# Patient Record
Sex: Female | Born: 1995 | Race: Black or African American | Hispanic: No | Marital: Single | Smoking: Former smoker
Health system: Southern US, Community
[De-identification: ages and names within clinical notes are randomized; demographics above are authoritative.]

## PROBLEM LIST (undated history)

## (undated) ENCOUNTER — Inpatient Hospital Stay (HOSPITAL_COMMUNITY): Payer: Self-pay

## (undated) DIAGNOSIS — B009 Herpesviral infection, unspecified: Secondary | ICD-10-CM

## (undated) DIAGNOSIS — F909 Attention-deficit hyperactivity disorder, unspecified type: Secondary | ICD-10-CM

## (undated) DIAGNOSIS — A749 Chlamydial infection, unspecified: Secondary | ICD-10-CM

## (undated) DIAGNOSIS — A64 Unspecified sexually transmitted disease: Secondary | ICD-10-CM

## (undated) HISTORY — DX: Unspecified sexually transmitted disease: A64

## (undated) HISTORY — PX: NO PAST SURGERIES: SHX2092

## (undated) HISTORY — DX: Attention-deficit hyperactivity disorder, unspecified type: F90.9

---
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
## 2016-10-15 ENCOUNTER — Other Ambulatory Visit (HOSPITAL_COMMUNITY): Payer: Self-pay | Admitting: Nurse Practitioner

## 2016-10-15 DIAGNOSIS — Z3689 Encounter for other specified antenatal screening: Secondary | ICD-10-CM

## 2016-10-15 DIAGNOSIS — Z3A18 18 weeks gestation of pregnancy: Secondary | ICD-10-CM

## 2016-10-15 DIAGNOSIS — F1290 Cannabis use, unspecified, uncomplicated: Secondary | ICD-10-CM

## 2016-10-15 LAB — OB RESULTS CONSOLE HIV ANTIBODY (ROUTINE TESTING): HIV: NONREACTIVE

## 2016-10-15 LAB — OB RESULTS CONSOLE ABO/RH: RH Type: POSITIVE

## 2016-10-15 LAB — OB RESULTS CONSOLE RUBELLA ANTIBODY, IGM: Rubella: IMMUNE

## 2016-10-15 LAB — OB RESULTS CONSOLE GC/CHLAMYDIA
Chlamydia: NEGATIVE
GC PROBE AMP, GENITAL: NEGATIVE

## 2016-10-15 LAB — OB RESULTS CONSOLE RPR: RPR: NONREACTIVE

## 2016-10-15 LAB — OB RESULTS CONSOLE HEPATITIS B SURFACE ANTIGEN: Hepatitis B Surface Ag: NEGATIVE

## 2016-10-15 LAB — OB RESULTS CONSOLE ANTIBODY SCREEN: Antibody Screen: NEGATIVE

## 2016-11-12 ENCOUNTER — Encounter (HOSPITAL_COMMUNITY): Payer: Self-pay | Admitting: Nurse Practitioner

## 2016-11-16 ENCOUNTER — Encounter (HOSPITAL_COMMUNITY): Payer: Self-pay | Admitting: *Deleted

## 2016-11-19 ENCOUNTER — Encounter (HOSPITAL_COMMUNITY): Payer: Self-pay

## 2016-11-19 ENCOUNTER — Ambulatory Visit (HOSPITAL_COMMUNITY)
Admission: RE | Admit: 2016-11-19 | Discharge: 2016-11-19 | Disposition: A | Discharge status: Home / Self Care | Payer: Medicaid Other | Source: Ambulatory Visit | Attending: Nurse Practitioner | Admitting: Nurse Practitioner

## 2016-11-19 ENCOUNTER — Other Ambulatory Visit (HOSPITAL_COMMUNITY): Payer: Self-pay | Admitting: *Deleted

## 2016-11-19 ENCOUNTER — Ambulatory Visit (HOSPITAL_COMMUNITY): Admission: RE | Admit: 2016-11-19 | Payer: Medicaid Other | Source: Ambulatory Visit

## 2016-11-19 ENCOUNTER — Other Ambulatory Visit (HOSPITAL_COMMUNITY): Payer: Self-pay | Admitting: Nurse Practitioner

## 2016-11-19 DIAGNOSIS — Z363 Encounter for antenatal screening for malformations: Secondary | ICD-10-CM

## 2016-11-19 DIAGNOSIS — O99212 Obesity complicating pregnancy, second trimester: Secondary | ICD-10-CM

## 2016-11-19 DIAGNOSIS — IMO0002 Reserved for concepts with insufficient information to code with codable children: Secondary | ICD-10-CM

## 2016-11-19 DIAGNOSIS — O99322 Drug use complicating pregnancy, second trimester: Secondary | ICD-10-CM | POA: Diagnosis not present

## 2016-11-19 DIAGNOSIS — Z3A18 18 weeks gestation of pregnancy: Secondary | ICD-10-CM

## 2016-11-19 DIAGNOSIS — F1290 Cannabis use, unspecified, uncomplicated: Secondary | ICD-10-CM

## 2016-11-19 DIAGNOSIS — E669 Obesity, unspecified: Secondary | ICD-10-CM | POA: Insufficient documentation

## 2016-11-19 DIAGNOSIS — Z0489 Encounter for examination and observation for other specified reasons: Secondary | ICD-10-CM

## 2016-11-19 DIAGNOSIS — Z3689 Encounter for other specified antenatal screening: Secondary | ICD-10-CM

## 2016-11-19 DIAGNOSIS — O09892 Supervision of other high risk pregnancies, second trimester: Secondary | ICD-10-CM | POA: Insufficient documentation

## 2016-11-19 HISTORY — DX: Chlamydial infection, unspecified: A74.9

## 2016-11-19 HISTORY — DX: Herpesviral infection, unspecified: B00.9

## 2016-12-17 ENCOUNTER — Ambulatory Visit (HOSPITAL_COMMUNITY)
Admission: RE | Admit: 2016-12-17 | Discharge: 2016-12-17 | Disposition: A | Discharge status: Home / Self Care | Payer: Medicaid Other | Source: Ambulatory Visit | Attending: Nurse Practitioner | Admitting: Nurse Practitioner

## 2017-01-22 NOTE — L&D Delivery Note (Signed)
Delivery Note At 12:44 AM a viable and healthy female was delivered via Vaginal, Spontaneous (Presentation: Left Occiput; Anterior ).  APGAR: 9, 9; weight pending .   Placenta status: spontaneous, intact.  Cord: 3V  Anesthesia:  Epidural  Episiotomy: None Lacerations: None Suture Repair: NA Est. Blood Loss (mL): 50  Mom to postpartum.  Baby to Couplet care / Skin to Skin.  Jill Byrd 04/22/2017, 12:59 AM

## 2017-03-23 ENCOUNTER — Encounter (HOSPITAL_COMMUNITY): Payer: Self-pay

## 2017-03-23 ENCOUNTER — Other Ambulatory Visit: Payer: Self-pay

## 2017-03-23 ENCOUNTER — Inpatient Hospital Stay (HOSPITAL_COMMUNITY)
Admission: AD | Admit: 2017-03-23 | Discharge: 2017-03-23 | Disposition: A | Discharge status: Home / Self Care | Payer: Medicaid Other | Source: Ambulatory Visit | Attending: Obstetrics | Admitting: Obstetrics

## 2017-03-23 DIAGNOSIS — Z87891 Personal history of nicotine dependence: Secondary | ICD-10-CM | POA: Diagnosis not present

## 2017-03-23 DIAGNOSIS — O26893 Other specified pregnancy related conditions, third trimester: Secondary | ICD-10-CM | POA: Diagnosis not present

## 2017-03-23 DIAGNOSIS — N390 Urinary tract infection, site not specified: Secondary | ICD-10-CM | POA: Diagnosis not present

## 2017-03-23 DIAGNOSIS — R110 Nausea: Secondary | ICD-10-CM | POA: Diagnosis not present

## 2017-03-23 DIAGNOSIS — R109 Unspecified abdominal pain: Secondary | ICD-10-CM | POA: Diagnosis present

## 2017-03-23 DIAGNOSIS — Z3A36 36 weeks gestation of pregnancy: Secondary | ICD-10-CM | POA: Insufficient documentation

## 2017-03-23 LAB — URINALYSIS, ROUTINE W REFLEX MICROSCOPIC
BILIRUBIN URINE: NEGATIVE
GLUCOSE, UA: NEGATIVE mg/dL
Ketones, ur: NEGATIVE mg/dL
NITRITE: POSITIVE — AB
PROTEIN: NEGATIVE mg/dL
Specific Gravity, Urine: 1.016 (ref 1.005–1.030)
pH: 6 (ref 5.0–8.0)

## 2017-03-23 LAB — CBC WITH DIFFERENTIAL/PLATELET
BASOS PCT: 0 %
Basophils Absolute: 0 10*3/uL (ref 0.0–0.1)
EOS ABS: 0.1 10*3/uL (ref 0.0–0.7)
EOS PCT: 1 %
HEMATOCRIT: 32.4 % — AB (ref 36.0–46.0)
Hemoglobin: 11.2 g/dL — ABNORMAL LOW (ref 12.0–15.0)
Lymphocytes Relative: 21 %
Lymphs Abs: 2.1 10*3/uL (ref 0.7–4.0)
MCH: 29 pg (ref 26.0–34.0)
MCHC: 34.6 g/dL (ref 30.0–36.0)
MCV: 83.9 fL (ref 78.0–100.0)
MONO ABS: 0.4 10*3/uL (ref 0.1–1.0)
Monocytes Relative: 4 %
NEUTROS ABS: 7.5 10*3/uL (ref 1.7–7.7)
Neutrophils Relative %: 74 %
PLATELETS: 255 10*3/uL (ref 150–400)
RBC: 3.86 MIL/uL — ABNORMAL LOW (ref 3.87–5.11)
RDW: 14.3 % (ref 11.5–15.5)
WBC: 10 10*3/uL (ref 4.0–10.5)

## 2017-03-23 MED ORDER — ONDANSETRON 4 MG PO TBDP: 4.0000 mg | ORAL_TABLET | Freq: Three times a day (TID) | ORAL | 0 refills | Status: DC | PRN

## 2017-03-23 MED ORDER — CEPHALEXIN 500 MG PO CAPS
500.0000 mg | ORAL_CAPSULE | Freq: Once | ORAL | Status: AC
  Administered 2017-03-23: 500 mg via ORAL
  Filled 2017-03-23: qty 1

## 2017-03-23 MED ORDER — ONDANSETRON 8 MG PO TBDP
8.0000 mg | ORAL_TABLET | Freq: Once | ORAL | Status: AC
  Administered 2017-03-23: 8 mg via ORAL
  Filled 2017-03-23: qty 1

## 2017-03-23 MED ORDER — CEPHALEXIN 500 MG PO CAPS: 500.0000 mg | ORAL_CAPSULE | Freq: Four times a day (QID) | ORAL | 0 refills | Status: DC

## 2017-03-23 NOTE — Discharge Instructions (Signed)

## 2017-03-23 NOTE — Progress Notes (Signed)
Patient has left CVA tenderness, explained to her that we have to obtain an UA and to call out when she can.

## 2017-03-23 NOTE — MAU Note (Signed)
Having bad back pain-constant, woke her at 0800,  Was having gas like pains in her abd, but there was no gas.   Got sick on the way here.

## 2017-03-23 NOTE — MAU Provider Note (Signed)
History   G1P0  @ 36.2 wks in with left flank pain, abd pain and nausea since 08:00 this morning. Denies vag bleediong or ROM.  CSN: [id number]  Arrival date & time 03/23/17  1100   None     Chief Complaint  Patient presents with  . Back Pain  . Emesis    HPI  Past Medical History:  Diagnosis Date  . ADHD (attention deficit hyperactivity disorder)   . Chlamydia   . HSV infection   . STD (sexually transmitted disease)    pos chlamydia 12/2010; 04/01/2012    Past Surgical History:  Procedure Laterality Date  . NO PAST SURGERIES      Family History  Problem Relation Age of Onset  . Alcohol abuse Mother   . Depression Mother   . Hypertension Father   . ADD / ADHD Brother   . Hypertension Maternal Grandmother   . Diabetes Maternal Grandmother     Social History   Tobacco Use  . Smoking status: Former Smoker    Packs/day: 1.00    Types: Cigarettes    Last attempt to quit: 09/17/2016    Years since quitting: 0.5  . Smokeless tobacco: Never Used  Substance Use Topics  . Alcohol use: Yes    Alcohol/week: 2.4 oz    Types: 4 Shots of liquor per week    Comment: quit with +UPT  . Drug use: Yes    Types: Marijuana    Comment: quit with +UPT    OB History    Gravida Para Term Preterm AB Living   1 0 0 0 0 0   SAB TAB Ectopic Multiple Live Births   0 0 0 0        Review of Systems  Constitutional: Negative.   HENT: Negative.   Eyes: Negative.   Respiratory: Negative.   Cardiovascular: Negative.   Gastrointestinal: Positive for abdominal pain and nausea.  Endocrine: Negative.   Genitourinary: Negative.   Musculoskeletal: Positive for back pain.  Skin: Negative.   Neurological: Negative.   Hematological: Negative.   Psychiatric/Behavioral: Negative.     Allergies  Patient has no known allergies.  Home Medications    BP (!) 98/59 (BP Location: Right Arm)   Pulse 87   Temp 98.6 F (37 C) (Oral)   Resp 18   Wt 225 lb 12 oz (102.4 kg)   LMP  07/12/2016 (Exact Date)   SpO2 99%   BMI 38.75 kg/m   Physical Exam  Constitutional: She is oriented to person, place, and time. She appears well-developed and well-nourished.  HENT:  Head: Normocephalic.  Eyes: Pupils are equal, round, and reactive to light.  Neck: Normal range of motion.  Cardiovascular: Normal rate, regular rhythm, normal heart sounds and intact distal pulses.  Pulmonary/Chest: Effort normal and breath sounds normal.  Abdominal: Soft. Bowel sounds are normal.  Genitourinary: Vagina normal and uterus normal.  Musculoskeletal: Normal range of motion.  Neurological: She is alert and oriented to person, place, and time. She has normal reflexes.  Skin: Skin is warm and dry.  Psychiatric: She has a normal mood and affect. Her behavior is normal. Judgment and thought content normal.    MAU Course  Procedures (including critical care time)  Labs Reviewed  CBC WITH DIFFERENTIAL/PLATELET - Abnormal; Notable for the following components:      Result Value   RBC 3.86 (*)    Hemoglobin 11.2 (*)    HCT 32.4 (*)    All other  components within normal limits  URINALYSIS, ROUTINE W REFLEX MICROSCOPIC   No results found.   1. Acute left flank pain   2. Abdominal pain in pregnancy, third trimester   3. Nausea without vomiting       MDM  VSS, FHR pattern reactive, no decels, accels noted. No uc's. SVE ft/th/post/high. Left CVAT. CBC normal. U/a pos nitrites. Culture done. POC discussed with Dr. Chestine Spore. Pt tolerating fluids well now. Will d/c home to follow up next week in office.

## 2017-03-24 LAB — URINE CULTURE

## 2017-03-27 LAB — OB RESULTS CONSOLE GBS: GBS: NEGATIVE

## 2017-04-17 ENCOUNTER — Telehealth (HOSPITAL_COMMUNITY): Payer: Self-pay | Admitting: *Deleted

## 2017-04-17 ENCOUNTER — Other Ambulatory Visit: Payer: Self-pay | Admitting: Obstetrics and Gynecology

## 2017-04-18 ENCOUNTER — Encounter (HOSPITAL_COMMUNITY): Payer: Self-pay | Admitting: *Deleted

## 2017-04-18 NOTE — Telephone Encounter (Signed)
Preadmission screen  

## 2017-04-20 ENCOUNTER — Encounter (HOSPITAL_COMMUNITY): Payer: Self-pay | Admitting: Emergency Medicine

## 2017-04-20 ENCOUNTER — Inpatient Hospital Stay (EMERGENCY_DEPARTMENT_HOSPITAL)
Admission: AD | Admit: 2017-04-20 | Discharge: 2017-04-21 | Disposition: A | Discharge status: Home / Self Care | Payer: Medicaid Other | Source: Ambulatory Visit | Attending: Obstetrics and Gynecology | Admitting: Obstetrics and Gynecology

## 2017-04-20 DIAGNOSIS — O99613 Diseases of the digestive system complicating pregnancy, third trimester: Secondary | ICD-10-CM

## 2017-04-20 DIAGNOSIS — Z3A40 40 weeks gestation of pregnancy: Secondary | ICD-10-CM

## 2017-04-20 DIAGNOSIS — K5900 Constipation, unspecified: Secondary | ICD-10-CM

## 2017-04-20 DIAGNOSIS — N760 Acute vaginitis: Secondary | ICD-10-CM

## 2017-04-20 DIAGNOSIS — B9689 Other specified bacterial agents as the cause of diseases classified elsewhere: Secondary | ICD-10-CM

## 2017-04-20 DIAGNOSIS — O479 False labor, unspecified: Secondary | ICD-10-CM

## 2017-04-20 LAB — WET PREP, GENITAL
SPERM: NONE SEEN
TRICH WET PREP: NONE SEEN
Yeast Wet Prep HPF POC: NONE SEEN

## 2017-04-20 NOTE — MAU Provider Note (Signed)
History     CSN: [id number]  Arrival date and time: 04/20/17 2128   First Provider Initiated Contact with Patient 04/20/17 2250      Chief Complaint  Patient presents with  . Abdominal Pain   HPI NEVAEH KORTE is a 22 y.o. G1P0000 at [redacted]w[redacted]d who presents with abdominal pain. Symptoms began earlier today. Reports lower abdominal cramping that comes & goes. Can't tell how frequently it occurs but doesn't think it happens more than 10 times per hour. Rates pain 5/10 when it occurs. Has not treated symptoms. Denies n/v/d, fever/chills, LOF, vaginal bleeding, or dysuria. Endorses change in vaginal discharge since yesterday. Describes as thin off white discharge with foul odor. Last BM was 1 week ago. States she has constipation that is worse with pregnancy and normally does not have BMs very often. Does not treat her constipation.   OB History    Gravida  1   Para  0   Term  0   Preterm  0   AB  0   Living  0     SAB  0   TAB  0   Ectopic  0   Multiple  0   Live Births              Past Medical History:  Diagnosis Date  . ADHD (attention deficit hyperactivity disorder)   . Chlamydia   . HSV infection   . STD (sexually transmitted disease)    pos chlamydia 12/2010; 04/01/2012    Past Surgical History:  Procedure Laterality Date  . NO PAST SURGERIES      Family History  Problem Relation Age of Onset  . Alcohol abuse Mother   . Depression Mother   . Hypertension Father   . ADD / ADHD Brother   . Hypertension Maternal Grandmother   . Diabetes Maternal Grandmother     Social History   Tobacco Use  . Smoking status: Former Smoker    Packs/day: 1.00    Types: Cigarettes    Last attempt to quit: 09/17/2016    Years since quitting: 0.5  . Smokeless tobacco: Never Used  Substance Use Topics  . Alcohol use: Not Currently    Alcohol/week: 2.4 oz    Types: 4 Shots of liquor per week    Comment: quit with +UPT  . Drug use: Not Currently    Types:  Marijuana    Comment: quit with +UPT    Allergies: No Known Allergies  Medications Prior to Admission  Medication Sig Dispense Refill Last Dose  . cephALEXin (KEFLEX) 500 MG capsule Take 1 capsule (500 mg total) by mouth 4 (four) times daily. 40 capsule 0 Past Month at Unknown time  . ondansetron (ZOFRAN ODT) 4 MG disintegrating tablet Take 1 tablet (4 mg total) by mouth every 8 (eight) hours as needed for nausea or vomiting. 20 tablet 0 More than a month at Unknown time    Review of Systems  Constitutional: Negative.   Gastrointestinal: Positive for abdominal pain and constipation. Negative for diarrhea, nausea and vomiting.  Genitourinary: Positive for vaginal discharge. Negative for dysuria and vaginal bleeding.   Physical Exam  Blood pressure 113/60, pulse (!) 101, temperature 97.9 F (36.6 C), temperature source Oral, resp. rate 17, height 5\' 6"  (1.676 m), weight 231 lb 8 oz (105 kg), last menstrual period 07/12/2016, SpO2 98 %.  Physical Exam  Nursing note and vitals reviewed. Constitutional: She is oriented to person, place, and time. She appears well-developed  and well-nourished. No distress.  HENT:  Head: Normocephalic and atraumatic.  Eyes: Conjunctivae are normal. Right eye exhibits no discharge. Left eye exhibits no discharge. No scleral icterus.  Neck: Normal range of motion.  Respiratory: Effort normal. No respiratory distress.  GI: Soft. There is no tenderness.  Neurological: She is alert and oriented to person, place, and time.  Skin: Skin is warm and dry. She is not diaphoretic.  Psychiatric: She has a normal mood and affect. Her behavior is normal. Judgment and thought content normal.    MAU Course  Procedures Results for orders placed or performed during the hospital encounter of 04/20/17 (from the past 24 hour(s))  Urinalysis, Routine w reflex microscopic     Status: Abnormal   Collection Time: 04/20/17  9:48 PM  Result Value Ref Range   Color, Urine  YELLOW YELLOW   APPearance HAZY (A) CLEAR   Specific Gravity, Urine 1.027 1.005 - 1.030   pH 6.0 5.0 - 8.0   Glucose, UA NEGATIVE NEGATIVE mg/dL   Hgb urine dipstick NEGATIVE NEGATIVE   Bilirubin Urine NEGATIVE NEGATIVE   Ketones, ur 5 (A) NEGATIVE mg/dL   Protein, ur 30 (A) NEGATIVE mg/dL   Nitrite NEGATIVE NEGATIVE   Leukocytes, UA TRACE (A) NEGATIVE   RBC / HPF 0-5 0 - 5 RBC/hpf   WBC, UA 6-30 0 - 5 WBC/hpf   Bacteria, UA RARE (A) NONE SEEN   Squamous Epithelial / LPF 0-5 (A) NONE SEEN   Mucus PRESENT    Ca Oxalate Crys, UA PRESENT   Wet prep, genital     Status: Abnormal   Collection Time: 04/20/17 10:59 PM  Result Value Ref Range   Yeast Wet Prep HPF POC NONE SEEN NONE SEEN   Trich, Wet Prep NONE SEEN NONE SEEN   Clue Cells Wet Prep HPF POC PRESENT (A) NONE SEEN   WBC, Wet Prep HPF POC MANY (A) NONE SEEN   Sperm NONE SEEN     MDM NST:  Baseline: 135 bpm, Variability: Good {> 6 bpm), Accelerations: Reactive, Decelerations: Absent and irregular contractions   Dilation: 3.5 Effacement (%): 50 Cervical Position: Middle Station: -3 Presentation: Vertex Exam by:: H.Hardy, RN Cervix unchanged from office visit last week  GC/CT & wet prep collected --- + clues  C/w Dr. Tenny Crawoss. Ok to discharge home. Tx BV.  Assessment and Plan  A: 1. Braxton Hick's contraction   2. [redacted] weeks gestation of pregnancy   3. BV (bacterial vaginosis)   4. Constipation during pregnancy in third trimester    P: Discharge home Rx flagyl & miralax Discussed tx of constipation Keep f/u with OB Discussed reasons to return to MAU GC/CT pending  Judeth Hornrin Sharea Guinther 04/20/2017, 10:50 PM

## 2017-04-20 NOTE — MAU Note (Signed)
Urine sent to lab 

## 2017-04-20 NOTE — MAU Note (Signed)
Pt presents to MAU with back pain that she rates a 5-6 on a scale of 0-10 and the pain comes and goes. Pt c/o abd pain that she rates a 7 on a scale of 0-10. Pt denies vag bleeding. States that she has small amt of discharge. Decrease fetal movement. Denies LOF.

## 2017-04-21 ENCOUNTER — Inpatient Hospital Stay (HOSPITAL_COMMUNITY): Payer: Medicaid Other | Admitting: Anesthesiology

## 2017-04-21 ENCOUNTER — Encounter (HOSPITAL_COMMUNITY): Payer: Self-pay

## 2017-04-21 ENCOUNTER — Inpatient Hospital Stay (HOSPITAL_COMMUNITY)
Admission: AD | Admit: 2017-04-21 | Discharge: 2017-04-23 | DRG: 807 | Disposition: A | Discharge status: Home / Self Care | Payer: Medicaid Other | Source: Ambulatory Visit | Attending: Obstetrics and Gynecology | Admitting: Obstetrics and Gynecology

## 2017-04-21 ENCOUNTER — Other Ambulatory Visit: Payer: Self-pay

## 2017-04-21 DIAGNOSIS — Z3A40 40 weeks gestation of pregnancy: Secondary | ICD-10-CM | POA: Diagnosis not present

## 2017-04-21 DIAGNOSIS — Z3483 Encounter for supervision of other normal pregnancy, third trimester: Secondary | ICD-10-CM | POA: Diagnosis present

## 2017-04-21 DIAGNOSIS — Z3490 Encounter for supervision of normal pregnancy, unspecified, unspecified trimester: Secondary | ICD-10-CM

## 2017-04-21 DIAGNOSIS — O479 False labor, unspecified: Secondary | ICD-10-CM

## 2017-04-21 DIAGNOSIS — O480 Post-term pregnancy: Secondary | ICD-10-CM | POA: Diagnosis present

## 2017-04-21 DIAGNOSIS — Z87891 Personal history of nicotine dependence: Secondary | ICD-10-CM

## 2017-04-21 LAB — CBC
HEMATOCRIT: 35.9 % — AB (ref 36.0–46.0)
HEMOGLOBIN: 11.9 g/dL — AB (ref 12.0–15.0)
MCH: 27.7 pg (ref 26.0–34.0)
MCHC: 33.1 g/dL (ref 30.0–36.0)
MCV: 83.7 fL (ref 78.0–100.0)
Platelets: 295 10*3/uL (ref 150–400)
RBC: 4.29 MIL/uL (ref 3.87–5.11)
RDW: 15 % (ref 11.5–15.5)
WBC: 12.8 10*3/uL — ABNORMAL HIGH (ref 4.0–10.5)

## 2017-04-21 LAB — URINALYSIS, ROUTINE W REFLEX MICROSCOPIC
Bilirubin Urine: NEGATIVE
Glucose, UA: NEGATIVE mg/dL
Hgb urine dipstick: NEGATIVE
Ketones, ur: 5 mg/dL — AB
Nitrite: NEGATIVE
Protein, ur: 30 mg/dL — AB
SPECIFIC GRAVITY, URINE: 1.027 (ref 1.005–1.030)
pH: 6 (ref 5.0–8.0)

## 2017-04-21 LAB — TYPE AND SCREEN
ABO/RH(D): O POS
ANTIBODY SCREEN: NEGATIVE

## 2017-04-21 MED ORDER — OXYTOCIN BOLUS FROM INFUSION: 500.0000 mL | Freq: Once | INTRAVENOUS | Status: DC

## 2017-04-21 MED ORDER — ONDANSETRON HCL 4 MG/2ML IJ SOLN: 4.0000 mg | Freq: Four times a day (QID) | INTRAMUSCULAR | Status: DC | PRN

## 2017-04-21 MED ORDER — DIPHENHYDRAMINE HCL 50 MG/ML IJ SOLN: 12.5000 mg | INTRAMUSCULAR | Status: DC | PRN

## 2017-04-21 MED ORDER — OXYTOCIN 40 UNITS IN LACTATED RINGERS INFUSION - SIMPLE MED
2.5000 [IU]/h | INTRAVENOUS | Status: DC
  Administered 2017-04-22: 2.5 [IU]/h via INTRAVENOUS

## 2017-04-21 MED ORDER — ACETAMINOPHEN 325 MG PO TABS: 650.0000 mg | ORAL_TABLET | ORAL | Status: DC | PRN

## 2017-04-21 MED ORDER — EPHEDRINE 5 MG/ML INJ: 10.0000 mg | INTRAVENOUS | Status: DC | PRN

## 2017-04-21 MED ORDER — LACTATED RINGERS IV SOLN: 500.0000 mL | Freq: Once | INTRAVENOUS | Status: DC

## 2017-04-21 MED ORDER — LIDOCAINE HCL (PF) 1 % IJ SOLN
30.0000 mL | INTRAMUSCULAR | Status: DC | PRN
  Filled 2017-04-21: qty 30

## 2017-04-21 MED ORDER — LIDOCAINE HCL (PF) 1 % IJ SOLN
INTRAMUSCULAR | Status: DC | PRN
  Administered 2017-04-21: 8 mL via EPIDURAL

## 2017-04-21 MED ORDER — BUTORPHANOL TARTRATE 1 MG/ML IJ SOLN: 1.0000 mg | INTRAMUSCULAR | Status: DC | PRN

## 2017-04-21 MED ORDER — LACTATED RINGERS IV SOLN
INTRAVENOUS | Status: DC
  Administered 2017-04-21: 22:00:00 via INTRAVENOUS

## 2017-04-21 MED ORDER — FENTANYL 2.5 MCG/ML BUPIVACAINE 1/10 % EPIDURAL INFUSION (WH - ANES)
14.0000 mL/h | INTRAMUSCULAR | Status: DC | PRN
  Administered 2017-04-21: 14 mL/h via EPIDURAL

## 2017-04-21 MED ORDER — OXYCODONE-ACETAMINOPHEN 5-325 MG PO TABS: 1.0000 | ORAL_TABLET | ORAL | Status: DC | PRN

## 2017-04-21 MED ORDER — SOD CITRATE-CITRIC ACID 500-334 MG/5ML PO SOLN: 30.0000 mL | ORAL | Status: DC | PRN

## 2017-04-21 MED ORDER — OXYTOCIN BOLUS FROM INFUSION
500.0000 mL | Freq: Once | INTRAVENOUS | Status: AC
  Administered 2017-04-22: 500 mL via INTRAVENOUS

## 2017-04-21 MED ORDER — LACTATED RINGERS IV SOLN
INTRAVENOUS | Status: DC
  Administered 2017-04-21: 21:00:00 via INTRAVENOUS

## 2017-04-21 MED ORDER — POLYETHYLENE GLYCOL 3350 17 G PO PACK: 17.0000 g | PACK | Freq: Every day | ORAL | 0 refills | Status: DC | PRN

## 2017-04-21 MED ORDER — FENTANYL 2.5 MCG/ML BUPIVACAINE 1/10 % EPIDURAL INFUSION (WH - ANES)
INTRAMUSCULAR | Status: AC
  Filled 2017-04-21: qty 100

## 2017-04-21 MED ORDER — LACTATED RINGERS IV SOLN: 500.0000 mL | INTRAVENOUS | Status: DC | PRN

## 2017-04-21 MED ORDER — METRONIDAZOLE 500 MG PO TABS: 500.0000 mg | ORAL_TABLET | Freq: Two times a day (BID) | ORAL | 0 refills | Status: DC

## 2017-04-21 MED ORDER — PHENYLEPHRINE 40 MCG/ML (10ML) SYRINGE FOR IV PUSH (FOR BLOOD PRESSURE SUPPORT)
80.0000 ug | PREFILLED_SYRINGE | INTRAVENOUS | Status: DC | PRN
  Filled 2017-04-21: qty 10

## 2017-04-21 MED ORDER — PHENYLEPHRINE 40 MCG/ML (10ML) SYRINGE FOR IV PUSH (FOR BLOOD PRESSURE SUPPORT): 80.0000 ug | PREFILLED_SYRINGE | INTRAVENOUS | Status: DC | PRN

## 2017-04-21 MED ORDER — TERBUTALINE SULFATE 1 MG/ML IJ SOLN: 0.2500 mg | Freq: Once | INTRAMUSCULAR | Status: DC | PRN

## 2017-04-21 MED ORDER — OXYTOCIN 40 UNITS IN LACTATED RINGERS INFUSION - SIMPLE MED
2.5000 [IU]/h | INTRAVENOUS | Status: DC
  Filled 2017-04-21: qty 1000

## 2017-04-21 MED ORDER — LIDOCAINE HCL (PF) 1 % IJ SOLN: 30.0000 mL | INTRAMUSCULAR | Status: DC | PRN

## 2017-04-21 MED ORDER — OXYCODONE-ACETAMINOPHEN 5-325 MG PO TABS: 2.0000 | ORAL_TABLET | ORAL | Status: DC | PRN

## 2017-04-21 MED ORDER — OXYTOCIN 40 UNITS IN LACTATED RINGERS INFUSION - SIMPLE MED: 1.0000 m[IU]/min | INTRAVENOUS | Status: DC

## 2017-04-21 NOTE — MAU Note (Signed)
Urine sent to lab 

## 2017-04-21 NOTE — Anesthesia Procedure Notes (Signed)
Epidural Patient location during procedure: OB Start time: 04/21/2017 9:55 PM End time: 04/21/2017 10:00 PM  Staffing Anesthesiologist: Bethena Midgetddono, Ramesh Moan, MD  Preanesthetic Checklist Completed: patient identified, site marked, surgical consent, pre-op evaluation, timeout performed, IV checked, risks and benefits discussed and monitors and equipment checked  Epidural Patient position: sitting Prep: site prepped and draped and DuraPrep Patient monitoring: continuous pulse ox and blood pressure Approach: midline Location: L3-L4 Injection technique: LOR air  Needle:  Needle type: Tuohy  Needle gauge: 17 G Needle length: 9 cm and 9 Needle insertion depth: 5 cm cm Catheter type: closed end flexible Catheter size: 19 Gauge Catheter at skin depth: 10 cm Test dose: negative  Assessment Events: blood not aspirated, injection not painful, no injection resistance, negative IV test and no paresthesia

## 2017-04-21 NOTE — Anesthesia Preprocedure Evaluation (Signed)

## 2017-04-21 NOTE — Progress Notes (Addendum)
G1 @ 40.[redacted] wksga. Here dt labor ctx progressively stronger today. Denies LOF or bleeding.   GBS -   SVE 6.5/80/BB   Provider notified. Report given. Orders received to admit.   Birthing suite notified. Report given. Room assigned to 164  2059: IV started by RN Amber  2114: Lab at bs drawing labs  2118: Pt to birthing via wheelchair.

## 2017-04-21 NOTE — Discharge Instructions (Signed)
Bacterial Vaginosis Bacterial vaginosis is a vaginal infection that occurs when the normal balance of bacteria in the vagina is disrupted. It results from an overgrowth of certain bacteria. This is the most common vaginal infection among women ages 7-44. Because bacterial vaginosis increases your risk for STIs (sexually transmitted infections), getting treated can help reduce your risk for chlamydia, gonorrhea, herpes, and HIV (human immunodeficiency virus). Treatment is also important for preventing complications in pregnant women, because this condition can cause an early (premature) delivery. What are the causes? This condition is caused by an increase in harmful bacteria that are normally present in small amounts in the vagina. However, the reason that the condition develops is not fully understood. What increases the risk? The following factors may make you more likely to develop this condition:  Having a new sexual partner or multiple sexual partners.  Having unprotected sex.  Douching.  Having an intrauterine device (IUD).  Smoking.  Drug and alcohol abuse.  Taking certain antibiotic medicines.  Being pregnant.  You cannot get bacterial vaginosis from toilet seats, bedding, swimming pools, or contact with objects around you. What are the signs or symptoms? Symptoms of this condition include:  Grey or white vaginal discharge. The discharge can also be watery or foamy.  A fish-like odor with discharge, especially after sexual intercourse or during menstruation.  Itching in and around the vagina.  Burning or pain with urination.  Some women with bacterial vaginosis have no signs or symptoms. How is this diagnosed? This condition is diagnosed based on:  Your medical history.  A physical exam of the vagina.  Testing a sample of vaginal fluid under a microscope to look for a large amount of bad bacteria or abnormal cells. Your health care provider may use a cotton swab  or a small wooden spatula to collect the sample.  How is this treated? This condition is treated with antibiotics. These may be given as a pill, a vaginal cream, or a medicine that is put into the vagina (suppository). If the condition comes back after treatment, a second round of antibiotics may be needed. Follow these instructions at home: Medicines  Take over-the-counter and prescription medicines only as told by your health care provider.  Take or use your antibiotic as told by your health care provider. Do not stop taking or using the antibiotic even if you start to feel better. General instructions  If you have a female sexual partner, tell her that you have a vaginal infection. She should see her health care provider and be treated if she has symptoms. If you have a female sexual partner, he does not need treatment.  During treatment: ? Avoid sexual activity until you finish treatment. ? Do not douche. ? Avoid alcohol as directed by your health care provider. ? Avoid breastfeeding as directed by your health care provider.  Drink enough water and fluids to keep your urine clear or pale yellow.  Keep the area around your vagina and rectum clean. ? Wash the area daily with warm water. ? Wipe yourself from front to back after using the toilet.  Keep all follow-up visits as told by your health care provider. This is important. How is this prevented?  Do not douche.  Wash the outside of your vagina with warm water only.  Use protection when having sex. This includes latex condoms and dental dams.  Limit how many sexual partners you have. To help prevent bacterial vaginosis, it is best to have sex with just  one partner (monogamous).  Make sure you and your sexual partner are tested for STIs.  Wear cotton or cotton-lined underwear.  Avoid wearing tight pants and pantyhose, especially during summer.  Limit the amount of alcohol that you drink.  Do not use any products that  contain nicotine or tobacco, such as cigarettes and e-cigarettes. If you need help quitting, ask your health care provider.  Do not use illegal drugs. Where to find more information:  Centers for Disease Control and Prevention: SolutionApps.co.zawww.cdc.gov/std  American Sexual Health Association (ASHA): www.ashastd.org  U.S. Department of Health and Health and safety inspectorHuman Services, Office on Women's Health: ConventionalMedicines.siwww.womenshealth.gov/ or http://www.anderson-williamson.info/https://www.womenshealth.gov/a-z-topics/bacterial-vaginosis Contact a health care provider if:  Your symptoms do not improve, even after treatment.  You have more discharge or pain when urinating.  You have a fever.  You have pain in your abdomen.  You have pain during sex.  You have vaginal bleeding between periods. Summary  Bacterial vaginosis is a vaginal infection that occurs when the normal balance of bacteria in the vagina is disrupted.  Because bacterial vaginosis increases your risk for STIs (sexually transmitted infections), getting treated can help reduce your risk for chlamydia, gonorrhea, herpes, and HIV (human immunodeficiency virus). Treatment is also important for preventing complications in pregnant women, because the condition can cause an early (premature) delivery.  This condition is treated with antibiotic medicines. These may be given as a pill, a vaginal cream, or a medicine that is put into the vagina (suppository). This information is not intended to replace advice given to you by your health care provider. Make sure you discuss any questions you have with your health care provider. Document Released: 01/08/2005 Document Revised: 05/14/2016 Document Reviewed: 09/24/2015 Elsevier Interactive Patient Education  2018 [employer] Constipation, Adult Constipation is when a person has fewer bowel movements in a week than normal, has difficulty having a bowel movement, or has stools that are dry, hard, or larger than normal. Constipation may be caused by an underlying  condition. It may become worse with age if a person takes certain medicines and does not take in enough fluids. Follow these instructions at home: Eating and drinking   Eat foods that have a lot of fiber, such as fresh fruits and vegetables, whole grains, and beans.  Limit foods that are high in fat, low in fiber, or overly processed, such as french fries, hamburgers, cookies, candies, and soda.  Drink enough fluid to keep your urine clear or pale yellow. General instructions  Exercise regularly or as told by your health care provider.  Go to the restroom when you have the urge to go. Do not hold it in.  Take over-the-counter and prescription medicines only as told by your health care provider. These include any fiber supplements.  Practice pelvic floor retraining exercises, such as deep breathing while relaxing the lower abdomen and pelvic floor relaxation during bowel movements.  Watch your condition for any changes.  Keep all follow-up visits as told by your health care provider. This is important. Contact a health care provider if:  You have pain that gets worse.  You have a fever.  You do not have a bowel movement after 4 days.  You vomit.  You are not hungry.  You lose weight.  You are bleeding from the anus.  You have thin, pencil-like stools. Get help right away if:  You have a fever and your symptoms suddenly get worse.  You leak stool or have blood in your stool.  Your abdomen is  bloated.  You have severe pain in your abdomen.  You feel dizzy or you faint. This information is not intended to replace advice given to you by your health care provider. Make sure you discuss any questions you have with your health care provider. Document Released: 10/07/2003 Document Revised: 07/29/2015 Document Reviewed: 06/29/2015 Elsevier Interactive Patient Education  2018 [employer]

## 2017-04-22 ENCOUNTER — Encounter (HOSPITAL_COMMUNITY): Payer: Self-pay

## 2017-04-22 ENCOUNTER — Other Ambulatory Visit: Payer: Self-pay

## 2017-04-22 LAB — GC/CHLAMYDIA PROBE AMP (~~LOC~~) NOT AT ARMC
Chlamydia: NEGATIVE
NEISSERIA GONORRHEA: NEGATIVE

## 2017-04-22 LAB — CBC
HCT: 32.7 % — ABNORMAL LOW (ref 36.0–46.0)
HEMOGLOBIN: 11.1 g/dL — AB (ref 12.0–15.0)
MCH: 28 pg (ref 26.0–34.0)
MCHC: 33.9 g/dL (ref 30.0–36.0)
MCV: 82.6 fL (ref 78.0–100.0)
PLATELETS: 257 10*3/uL (ref 150–400)
RBC: 3.96 MIL/uL (ref 3.87–5.11)
RDW: 15 % (ref 11.5–15.5)
WBC: 15.8 10*3/uL — ABNORMAL HIGH (ref 4.0–10.5)

## 2017-04-22 LAB — ABO/RH: ABO/RH(D): O POS

## 2017-04-22 LAB — RPR: RPR Ser Ql: NONREACTIVE

## 2017-04-22 MED ORDER — SIMETHICONE 80 MG PO CHEW: 80.0000 mg | CHEWABLE_TABLET | ORAL | Status: DC | PRN

## 2017-04-22 MED ORDER — METHYLERGONOVINE MALEATE 0.2 MG/ML IJ SOLN: 0.2000 mg | INTRAMUSCULAR | Status: DC | PRN

## 2017-04-22 MED ORDER — COCONUT OIL OIL
1.0000 "application " | TOPICAL_OIL | Status: DC | PRN
  Filled 2017-04-22: qty 120

## 2017-04-22 MED ORDER — ONDANSETRON HCL 4 MG/2ML IJ SOLN: 4.0000 mg | INTRAMUSCULAR | Status: DC | PRN

## 2017-04-22 MED ORDER — DIBUCAINE 1 % RE OINT
1.0000 "application " | TOPICAL_OINTMENT | RECTAL | Status: DC | PRN
  Filled 2017-04-22: qty 28

## 2017-04-22 MED ORDER — ONDANSETRON HCL 4 MG PO TABS: 4.0000 mg | ORAL_TABLET | ORAL | Status: DC | PRN

## 2017-04-22 MED ORDER — WITCH HAZEL-GLYCERIN EX PADS: 1.0000 "application " | MEDICATED_PAD | CUTANEOUS | Status: DC | PRN

## 2017-04-22 MED ORDER — IBUPROFEN 600 MG PO TABS
600.0000 mg | ORAL_TABLET | Freq: Four times a day (QID) | ORAL | Status: DC
  Administered 2017-04-22 – 2017-04-23 (×4): 600 mg via ORAL
  Filled 2017-04-22 (×4): qty 1

## 2017-04-22 MED ORDER — DIPHENHYDRAMINE HCL 25 MG PO CAPS: 25.0000 mg | ORAL_CAPSULE | Freq: Four times a day (QID) | ORAL | Status: DC | PRN

## 2017-04-22 MED ORDER — ZOLPIDEM TARTRATE 5 MG PO TABS: 5.0000 mg | ORAL_TABLET | Freq: Every evening | ORAL | Status: DC | PRN

## 2017-04-22 MED ORDER — BENZOCAINE-MENTHOL 20-0.5 % EX AERO
1.0000 "application " | INHALATION_SPRAY | CUTANEOUS | Status: DC | PRN
  Filled 2017-04-22 (×2): qty 56

## 2017-04-22 MED ORDER — OXYCODONE-ACETAMINOPHEN 5-325 MG PO TABS: 1.0000 | ORAL_TABLET | ORAL | Status: DC | PRN

## 2017-04-22 MED ORDER — TETANUS-DIPHTH-ACELL PERTUSSIS 5-2.5-18.5 LF-MCG/0.5 IM SUSP: 0.5000 mL | Freq: Once | INTRAMUSCULAR | Status: DC

## 2017-04-22 MED ORDER — METHYLERGONOVINE MALEATE 0.2 MG PO TABS: 0.2000 mg | ORAL_TABLET | ORAL | Status: DC | PRN

## 2017-04-22 MED ORDER — OXYCODONE-ACETAMINOPHEN 5-325 MG PO TABS: 2.0000 | ORAL_TABLET | ORAL | Status: DC | PRN

## 2017-04-22 MED ORDER — SENNOSIDES-DOCUSATE SODIUM 8.6-50 MG PO TABS
2.0000 | ORAL_TABLET | ORAL | Status: DC
  Administered 2017-04-22: 2 via ORAL
  Filled 2017-04-22: qty 2

## 2017-04-22 MED ORDER — PRENATAL MULTIVITAMIN CH
1.0000 | ORAL_TABLET | Freq: Every day | ORAL | Status: DC
  Administered 2017-04-22: 1 via ORAL
  Filled 2017-04-22: qty 1

## 2017-04-22 MED ORDER — ACETAMINOPHEN 325 MG PO TABS: 650.0000 mg | ORAL_TABLET | ORAL | Status: DC | PRN

## 2017-04-22 NOTE — Addendum Note (Signed)
Addendum  created 04/22/17 0902 by Shanon PayorGregory, Hajira Verhagen M, CRNA   Charge Capture section accepted, Sign clinical note

## 2017-04-22 NOTE — Anesthesia Postprocedure Evaluation (Signed)
Anesthesia Post Note  Patient: Jill LeatherwoodMariah M Byrd  Procedure(s) Performed: AN AD HOC LABOR EPIDURAL     Patient location during evaluation: Mother Baby Anesthesia Type: Epidural Level of consciousness: awake and alert Pain management: pain level controlled Vital Signs Assessment: post-procedure vital signs reviewed and stable Respiratory status: spontaneous breathing, nonlabored ventilation and respiratory function stable Cardiovascular status: stable Postop Assessment: no headache, no backache and epidural receding Anesthetic complications: no    Last Vitals:  Vitals:   04/22/17 0210 04/22/17 0319  BP:  (!) 103/56  Pulse:  92  Resp: 16 18  Temp: 36.9 C 36.9 C  SpO2:  100%    Last Pain:  Vitals:   04/22/17 0319  TempSrc: Oral  PainSc: 0-No pain   Pain Goal: Patients Stated Pain Goal: 4 (04/21/17 2042)               Dedrick Heffner

## 2017-04-22 NOTE — H&P (Signed)
Jill Byrd is a 22 y.o. female presenting for labor  22 yo G1P0 @ 40+3 presents for painful contractions and was confirmed to be in labor. She initiated prenatal care at 25 weeks in our office [facility] OB History    Gravida  1   Para  0   Term  0   Preterm  0   AB  0   Living  0     SAB  0   TAB  0   Ectopic  0   Multiple  0   Live Births             Past Medical History:  Diagnosis Date  . ADHD (attention deficit hyperactivity disorder)   . Chlamydia   . HSV infection   . STD (sexually transmitted disease)    pos chlamydia 12/2010; 04/01/2012   Past Surgical History:  Procedure Laterality Date  . NO PAST SURGERIES     Family History: family history includes ADD / ADHD in her brother; Alcohol abuse in her mother; Depression in her mother; Diabetes in her maternal grandmother; Hypertension in her father and maternal grandmother. Social History:  reports that she quit smoking about 7 months ago. Her smoking use included cigarettes. She smoked 1.00 pack per day. She has never used smokeless tobacco. She reports that she drank about 2.4 oz of alcohol per week. She reports that she has current or past drug history. Drug: Marijuana.     Maternal Diabetes: No Genetic Screening: Normal Maternal Ultrasounds/Referrals: Normal Fetal Ultrasounds or other Referrals:  None Maternal Substance Abuse:  Yes:  Type: Marijuana Significant Maternal Medications:  None Significant Maternal Lab Results:  None Other Comments:  None  ROS History Dilation: 10 Effacement (%): 80 Station: Plus 2 Exam by:: Lexie Ament, RN Blood pressure 109/61, pulse 100, temperature 98.5 F (36.9 C), temperature source Axillary, resp. rate 18, height 5\' 6"  (1.676 m), weight 103 kg (227 lb), last menstrual period 07/12/2016, SpO2 100 %. Exam Physical Exam  Prenatal labs: ABO, Rh: --/--/O POS (03/31 2114) Antibody: NEG (03/31 2114) Rubella: Immune  (09/24 0000) RPR: Nonreactive (09/24 0000)  HBsAg: Negative (09/24 0000)  HIV: Non-reactive (09/24 0000)  GBS: Negative (03/06 0000)   Assessment/Plan: 1) Admit 2) Expectant management  3) Anticipate svd 4) Epidural on request   Waynard ReedsKendra Trinity Hyland 04/22/2017, 1:00 AM

## 2017-04-22 NOTE — Lactation Note (Signed)
This note was copied from a baby's chart. Lactation Consultation Note  Patient Name: Boy Doreatha LewMariah Wilmouth ZOXWR'UToday's Date: 04/22/2017 Reason for consult: Initial assessment;Primapara;Term Breastfeeding consultation services and support information given to patient.  Baby is 12 hours old and feeding well on right side.  Mom having difficulty on left.  Assisted with positioning baby in football hold on left side.  Instructed on hand expression and large drop expressed.  Baby latched easily with good breast compression.  Observed baby feed actively for 10 minutes and baby continued to feed when I left.  Basic teaching done and questions answered.  Instructed to feed with cues and call for assist prn.  Maternal Data Has patient been taught Hand Expression?: Yes Does the patient have breastfeeding experience prior to this delivery?: No  Feeding Feeding Type: Breast Fed Length of feed: 14 min  LATCH Score Latch: Grasps breast easily, tongue down, lips flanged, rhythmical sucking.  Audible Swallowing: A few with stimulation  Type of Nipple: Everted at rest and after stimulation  Comfort (Breast/Nipple): Soft / non-tender  Hold (Positioning): Assistance needed to correctly position infant at breast and maintain latch.  LATCH Score: 8  Interventions Interventions: Breast feeding basics reviewed;Assisted with latch;Breast compression;Skin to skin;Adjust position;Breast massage;Support pillows;Hand express;Position options  Lactation Tools Discussed/Used     Consult Status Consult Status: Follow-up Date: 04/23/17 Follow-up type: In-patient    Huston FoleyMOULDEN, Garan Frappier S 04/22/2017, 1:15 PM

## 2017-04-22 NOTE — Anesthesia Postprocedure Evaluation (Signed)
Anesthesia Post Note  Patient: Jill LeatherwoodMariah M Byrd  Procedure(s) Performed: AN AD HOC LABOR EPIDURAL     Patient location during evaluation: Mother Baby Anesthesia Type: Epidural Level of consciousness: awake and alert and oriented Pain management: satisfactory to patient Vital Signs Assessment: post-procedure vital signs reviewed and stable Respiratory status: spontaneous breathing and nonlabored ventilation Cardiovascular status: stable Postop Assessment: no headache, no backache, no signs of nausea or vomiting, adequate PO intake and patient able to bend at knees (patient up walking) Anesthetic complications: no    Last Vitals:  Vitals:   04/22/17 0319 04/22/17 0730  BP: (!) 103/56 (!) 104/54  Pulse: 92 88  Resp: 18 17  Temp: 36.9 C 36.6 C  SpO2: 100%     Last Pain:  Vitals:   04/22/17 0730  TempSrc: Oral  PainSc: 0-No pain   Pain Goal: Patients Stated Pain Goal: 4 (04/21/17 2042)               Madison HickmanGREGORY,Vincent Streater

## 2017-04-22 NOTE — Progress Notes (Signed)
PPD0  Stopped in to check on patient and she appears to be sleeping comfortably so I did not wake her.  Vitals:   04/22/17 0201 04/22/17 0210 04/22/17 0319 04/22/17 0730  BP: (!) 111/58  (!) 103/56 (!) 104/54  Pulse: 85  92 88  Resp:  16 18 17   Temp:  98.4 F (36.9 C) 98.4 F (36.9 C) 97.9 F (36.6 C)  TempSrc:  Oral Oral Oral  SpO2:   100%   Weight:      Height:        General: NAD  Lab Results  Component Value Date   WBC 15.8 (H) 04/22/2017   HGB 11.1 (L) 04/22/2017   HCT 32.7 (L) 04/22/2017   MCV 82.6 04/22/2017   PLT 257 04/22/2017    --/--/O POS, O POS Performed at [facility], [address] [place], [place] [address]  (03/31 2114)  A/P Post partum day 0 s/p NSVD  Routine care.   Philip AspenALLAHAN, Barbee Mamula

## 2017-04-23 NOTE — Discharge Summary (Signed)
Obstetric Discharge Summary Reason for Admission: onset of labor Prenatal Procedures: ultrasound Intrapartum Procedures: spontaneous vaginal delivery Postpartum Procedures: none Complications-Operative and Postpartum: none Hemoglobin  Date Value Ref Range Status  04/22/2017 11.1 (L) 12.0 - 15.0 g/dL Final   HCT  Date Value Ref Range Status  04/22/2017 32.7 (L) 36.0 - 46.0 % Final    Physical Exam:  General: alert Lochia: appropriate Uterine Fundus: firm  Discharge Diagnoses: Term Pregnancy-delivered  Discharge Information: Date: 04/23/2017 Activity: pelvic rest Diet: routine Medications: PNV Condition: stable Instructions: refer to practice specific booklet Discharge to: home Follow-up Information    Philip AspenCallahan, Sidney, DO. Schedule an appointment as soon as possible for a visit in 1 month(s).   Specialty:  Obstetrics and Gynecology Contact information: [address] [place] [address] [phone]           Newborn Data: Live born female  Birth Weight: 6 lb 10.5 oz (3019 g) APGAR: 9, 9  Newborn Delivery   Birth date/time:  04/22/2017 00:44:00 Delivery type:  Vaginal, Spontaneous     Home with mother.  Murl Golladay E 04/23/2017, 9:31 AM

## 2017-04-23 NOTE — Progress Notes (Signed)
PPD#1 Pt without complaints . Would like to go home.  VSSAF IMP/ stable Plan/ Discharge

## 2017-04-25 ENCOUNTER — Inpatient Hospital Stay (HOSPITAL_COMMUNITY): Admission: RE | Admit: 2017-04-25 | Payer: Medicaid Other | Source: Ambulatory Visit

## 2017-07-02 ENCOUNTER — Emergency Department (HOSPITAL_COMMUNITY): Payer: Medicaid Other

## 2017-07-02 ENCOUNTER — Other Ambulatory Visit: Payer: Self-pay

## 2017-07-02 ENCOUNTER — Emergency Department (HOSPITAL_COMMUNITY)
Admission: EM | Admit: 2017-07-02 | Discharge: 2017-07-02 | Disposition: A | Discharge status: Home / Self Care | Payer: Medicaid Other | Attending: Emergency Medicine | Admitting: Emergency Medicine

## 2017-07-02 DIAGNOSIS — R51 Headache: Secondary | ICD-10-CM

## 2017-07-02 DIAGNOSIS — Z87891 Personal history of nicotine dependence: Secondary | ICD-10-CM | POA: Insufficient documentation

## 2017-07-02 DIAGNOSIS — Z79899 Other long term (current) drug therapy: Secondary | ICD-10-CM | POA: Insufficient documentation

## 2017-07-02 DIAGNOSIS — G43809 Other migraine, not intractable, without status migrainosus: Secondary | ICD-10-CM | POA: Insufficient documentation

## 2017-07-02 DIAGNOSIS — R519 Headache, unspecified: Secondary | ICD-10-CM

## 2017-07-02 LAB — POC URINE PREG, ED: PREG TEST UR: NEGATIVE

## 2017-07-02 MED ORDER — KETOROLAC TROMETHAMINE 15 MG/ML IJ SOLN
15.0000 mg | Freq: Once | INTRAMUSCULAR | Status: AC
  Administered 2017-07-02: 15 mg via INTRAVENOUS
  Filled 2017-07-02: qty 1

## 2017-07-02 MED ORDER — DEXAMETHASONE SODIUM PHOSPHATE 10 MG/ML IJ SOLN
10.0000 mg | Freq: Once | INTRAMUSCULAR | Status: AC
  Administered 2017-07-02: 10 mg via INTRAVENOUS
  Filled 2017-07-02: qty 1

## 2017-07-02 MED ORDER — SODIUM CHLORIDE 0.9 % IV BOLUS
1000.0000 mL | Freq: Once | INTRAVENOUS | Status: AC
  Administered 2017-07-02: 1000 mL via INTRAVENOUS

## 2017-07-02 NOTE — ED Provider Notes (Signed)
[facility]-EMERGENCY DEPT Provider Note   CSN: [id number] Arrival date & time: 07/02/17  4098     History   Chief Complaint Chief Complaint  Patient presents with  . Headache    HPI Jill Byrd is a 22 y.o. female.  22yo female presents with complaint of headache onset 3 days ago, constant, worse with any changes in position, lights and sounds, throbbing in nature, located frontal and occipital areas. Tried tylenol yesterday without relief. No history of similar headaches/headaches previously, denies changes in vision/speech/gait, denies nausea, vomiting. No prior head imaging. Reports normal vaginal delivery 2 months ago, no complications, no hx of HTN.     Past Medical History:  Diagnosis Date  . ADHD (attention deficit hyperactivity disorder)   . Chlamydia   . HSV infection   . STD (sexually transmitted disease)    pos chlamydia 12/2010; 04/01/2012    Patient Active Problem List   Diagnosis Date Noted  . Spontaneous vaginal delivery 04/22/2017  . Pregnancy 04/21/2017  . Post-dates pregnancy 04/21/2017  . Chlamydia infection 04/09/2012  . ADD (attention deficit disorder) 10/11/2011    Past Surgical History:  Procedure Laterality Date  . NO PAST SURGERIES       OB History    Gravida  1   Para  1   Term  1   Preterm  0   AB  0   Living  1     SAB  0   TAB  0   Ectopic  0   Multiple  0   Live Births  1            Home Medications    Prior to Admission medications   Medication Sig Start Date End Date Taking? Authorizing Provider  acetaminophen (TYLENOL) 500 MG tablet Take 1,000 mg by mouth daily as needed (headache).   Yes [provider]    Family History Family History  Problem Relation Age of Onset  . Alcohol abuse Mother   . Depression Mother   . Hypertension Father   . ADD / ADHD Brother   . Hypertension Maternal Grandmother   . Diabetes Maternal Grandmother     Social History Social  History   Tobacco Use  . Smoking status: Former Smoker    Packs/day: 1.00    Types: Cigarettes    Last attempt to quit: 09/17/2016    Years since quitting: 0.7  . Smokeless tobacco: Never Used  Substance Use Topics  . Alcohol use: Not Currently    Alcohol/week: 2.4 oz    Types: 4 Shots of liquor per week    Comment: quit with +UPT  . Drug use: Not Currently    Types: Marijuana    Comment: quit with +UPT     Allergies   Patient has no known allergies.   Review of Systems Review of Systems  Constitutional: Negative for chills and fever.  HENT: Negative for congestion, ear discharge, ear pain, sinus pressure, sinus pain, sneezing and sore throat.   Eyes: Negative for visual disturbance.  Respiratory: Negative for cough.   Gastrointestinal: Negative for abdominal pain, constipation, diarrhea, nausea and vomiting.  Genitourinary: Negative for dysuria, frequency and urgency.  Musculoskeletal: Negative for neck pain and neck stiffness.  Skin: Negative for rash and wound.  Allergic/Immunologic: Negative for immunocompromised state.  Neurological: Positive for headaches. Negative for dizziness, facial asymmetry, speech difficulty and weakness.  Hematological: Does not bruise/bleed easily.  Psychiatric/Behavioral: Negative for confusion.  All other  systems reviewed and are negative.    Physical Exam Updated Vital Signs BP 104/66   Pulse 86   Temp 99.5 F (37.5 C) (Oral)   Resp 15   Ht 5\' 6"  (1.676 m)   Wt 99.8 kg (220 lb)   LMP 06/07/2016   SpO2 100%   BMI 35.51 kg/m   Physical Exam  Constitutional: She is oriented to person, place, and time. She appears well-developed and well-nourished.  Non-toxic appearance. She does not appear ill. No distress.  HENT:  Head: Normocephalic and atraumatic.  Mouth/Throat: Oropharynx is clear and moist.  Eyes: Pupils are equal, round, and reactive to light. EOM are normal. Right eye exhibits no nystagmus. Left eye exhibits no  nystagmus. Pupils are equal.  Neck: Normal range of motion. Neck supple.  Cardiovascular: Normal rate, regular rhythm, normal heart sounds and intact distal pulses.  No murmur heard. Pulmonary/Chest: Effort normal. No stridor. No respiratory distress.  Abdominal: Soft. There is no tenderness.  Neurological: She is alert and oriented to person, place, and time. She has normal strength. She displays normal reflexes. No cranial nerve deficit. GCS eye subscore is 4. GCS verbal subscore is 5. GCS motor subscore is 6. She displays no Babinski's sign on the right side. She displays no Babinski's sign on the left side.  No pronator drift, normal heel to shin  Skin: Skin is warm and dry. No rash noted.  Psychiatric: She has a normal mood and affect. Her behavior is normal.  Nursing note and vitals reviewed.    ED Treatments / Results  Labs (all labs ordered are listed, but only abnormal results are displayed) Labs Reviewed  POC URINE PREG, ED    EKG None  Radiology Ct Head Wo Contrast  Result Date: 07/02/2017 CLINICAL DATA:  Severe headache for 2 days. EXAM: CT HEAD WITHOUT CONTRAST TECHNIQUE: Contiguous axial images were obtained from the base of the skull through the vertex without intravenous contrast. COMPARISON:  None. FINDINGS: Brain: The ventricles are normal in size and configuration. No extra-axial fluid collections are identified. The gray-white differentiation is maintained. No CT findings for acute hemispheric infarction or intracranial hemorrhage. No mass lesions. The brainstem and cerebellum are normal. Vascular: No hyperdense vessels or obvious aneurysm. Skull: No acute skull fracture.  No bone lesion. Sinuses/Orbits: The paranasal sinuses and mastoid air cells are clear. The globes are intact. Other: No scalp lesions, laceration or hematoma. IMPRESSION: Normal head CT. Electronically Signed   By: Rudie MeyerP.  Gallerani M.D.   On: 07/02/2017 13:00    Procedures Procedures (including  critical care time)  Medications Ordered in ED Medications  sodium chloride 0.9 % bolus 1,000 mL (1,000 mLs Intravenous New Bag/Given 07/02/17 1259)  ketorolac (TORADOL) 15 MG/ML injection 15 mg (has no administration in time range)  dexamethasone (DECADRON) injection 10 mg (has no administration in time range)     Initial Impression / Assessment and Plan / ED Course  I have reviewed the triage vital signs and the nursing notes.  Pertinent labs & imaging results that were available during my care of the patient were reviewed by me and considered in my medical decision making (see chart for details).  Clinical Course as of Jul 02 1321  Tue Jul 02, 2017  1316 Negative. Good for Ct.   Preg Test, Ur: NEGATIVE [LM]  1316 Headache resolved..  CT Head Wo Contrast [LM]  1320 22 yo female presents with headache x 3 days, no hx of similar headaches previously. Gradual onset,  no hx or family history of aneurism. Neuro exam normal. Patient given fluids, headache improving. CT head normal- no evidence of bleed or masses. Patient will be given Toradol and Decadron prior to dc, encouraged to follow up with PCP, return to ER for any worsening or concerning symptoms.    [LM]    Clinical Course User Index [LM] Jeannie Fend, PA-C      Final Clinical Impressions(s) / ED Diagnoses   Final diagnoses:  Acute nonintractable headache, unspecified headache type    ED Discharge Orders    None       Alden Hipp 07/02/17 1323    Mesner, Barbara Cower, MD 07/02/17 1528

## 2017-07-02 NOTE — ED Triage Notes (Signed)
Pt states that every time she sits or stands up, she gets an extreme headache. Has been occurring for 2 days. Today it was the worst.

## 2017-07-02 NOTE — Discharge Instructions (Addendum)
Follow up with your doctor, return to ER for worsening or concerning symptoms. Home to rest, Motrin/Tylenol as needed as directed, hydrating fluids (Gatorade).

## 2020-01-15 IMAGING — CT CT HEAD W/O CM
3 series · 15 of 46 positions shown, 18 images · non-contrast
Comparison: None.

CLINICAL DATA: Severe headache for 2 days.

EXAM:
CT HEAD WITHOUT CONTRAST
TECHNIQUE: Contiguous axial images were obtained from the base of the skull
through the vertex without intravenous contrast.

[Series 2: head wo · axial · 0.43mm/px · z∈[-124,-4]mm · 9 of 29 slices shown, 12 images]
[im 3/29  brain]
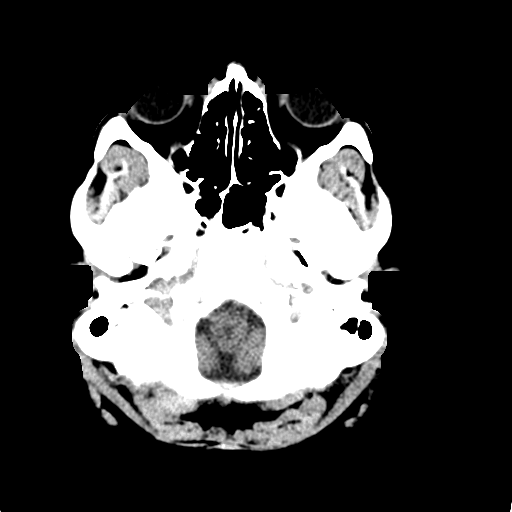
[im 3/29  bone]
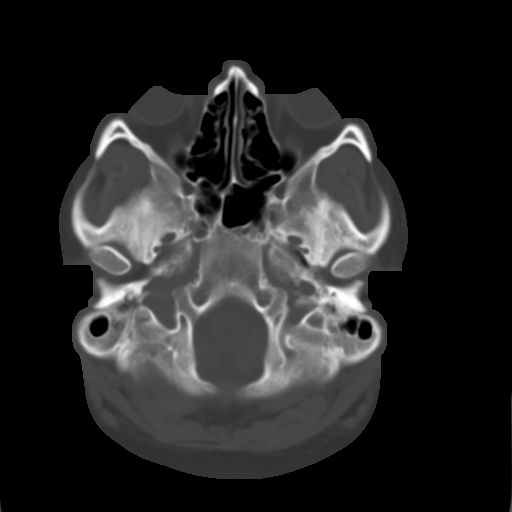
[im 6/29  brain]
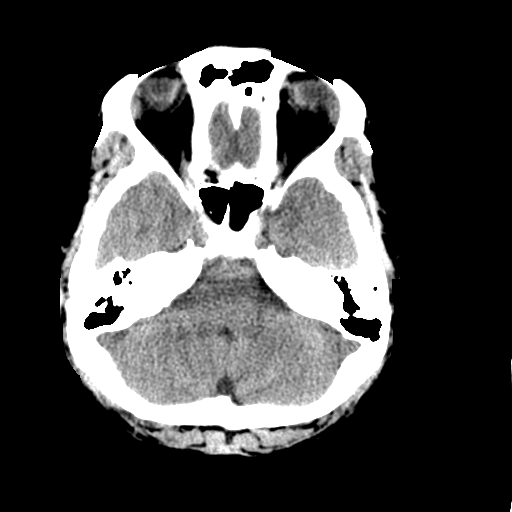
[im 9/29  brain]
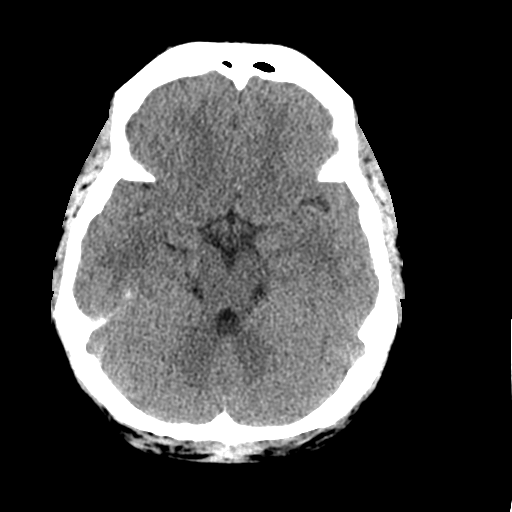
[im 12/29  brain]
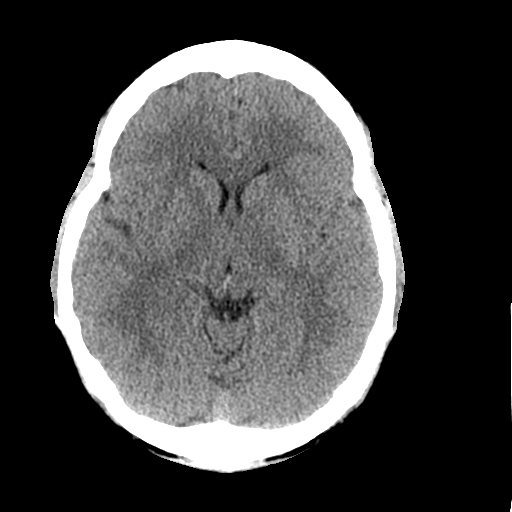
[im 15/29  brain]
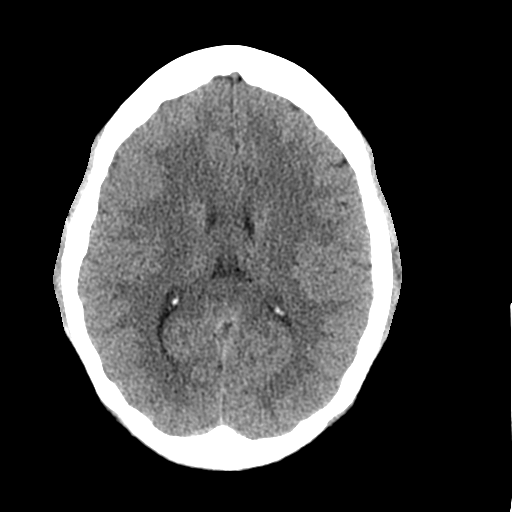
[im 15/29  bone]
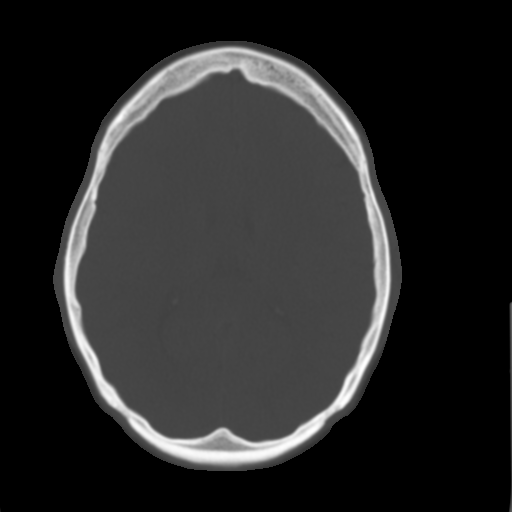
[im 18/29  brain]
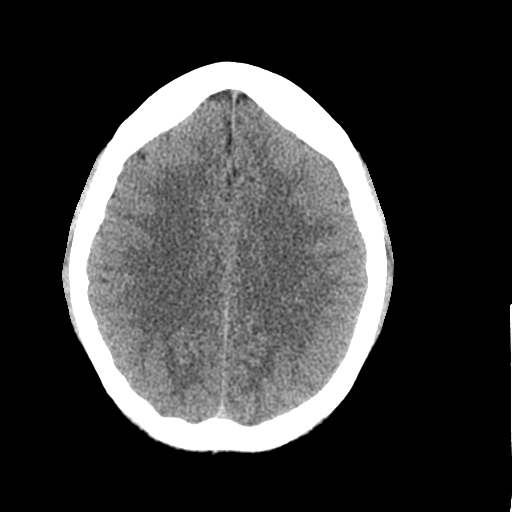
[im 21/29  brain]
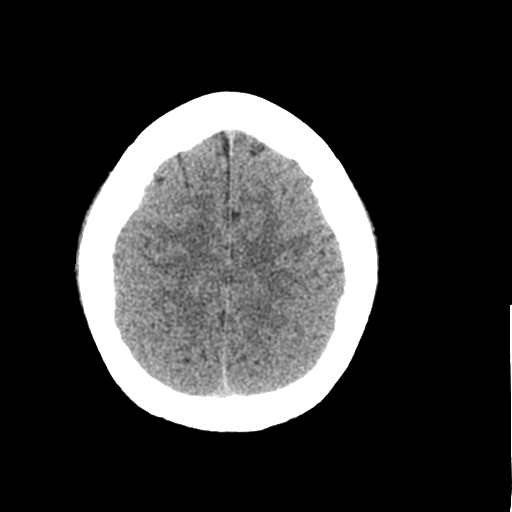
[im 24/29  brain]
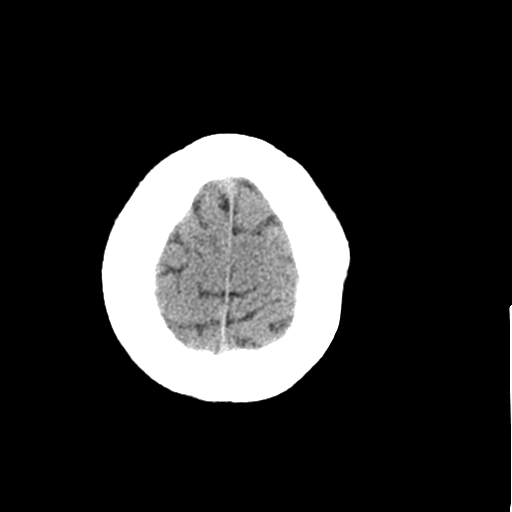
[im 27/29  brain]
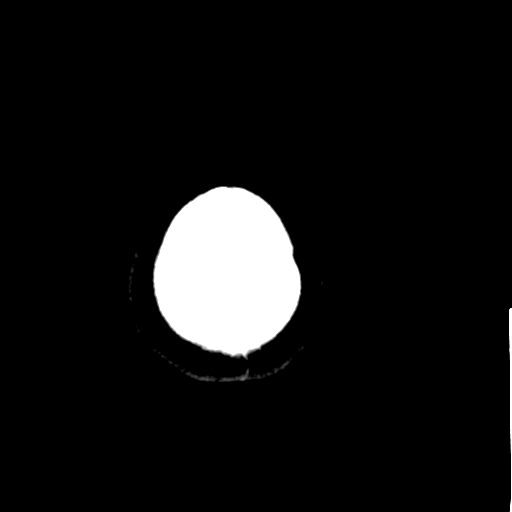
[im 27/29  bone]
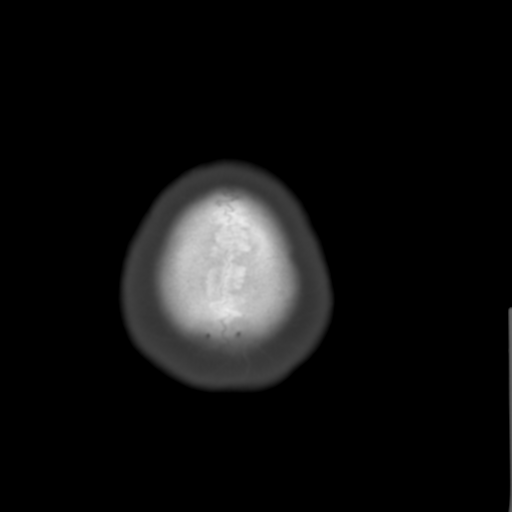

[Series 4: coronal soft tissue · coronal · 0.28mm/px · 3 of 66 slices shown]
[im 22/66  brain]
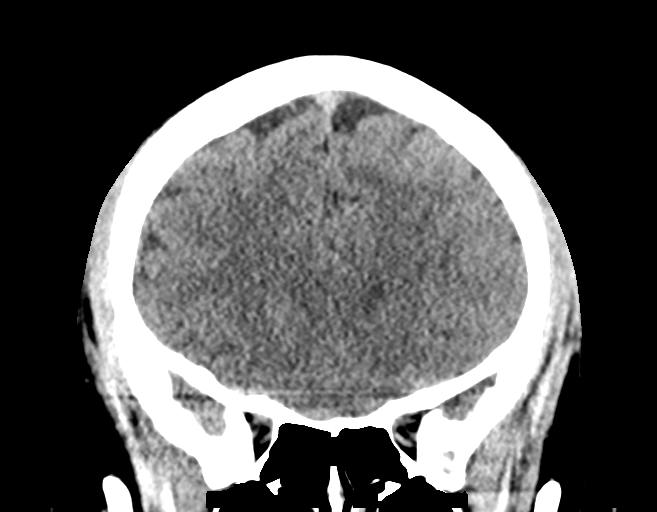
[im 29/66  brain]
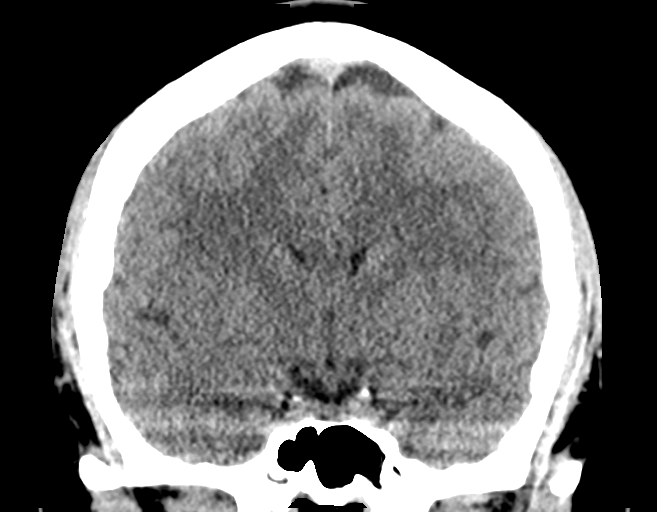
[im 37/66  brain]
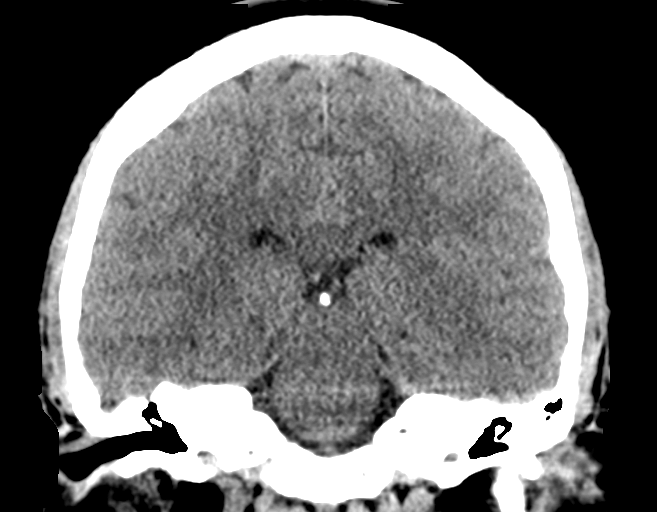

[Series 5: sagittal soft tissue · sagittal · 0.29mm/px · 3 of 62 slices shown]
[im 21/62  brain]
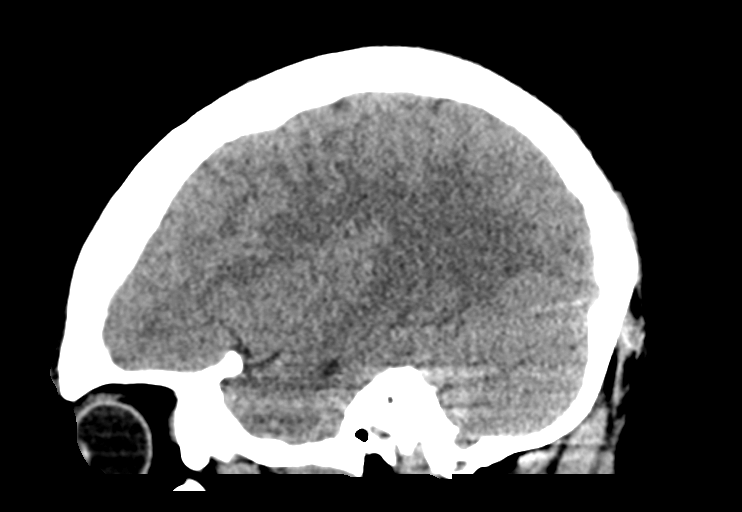
[im 31/62  brain]
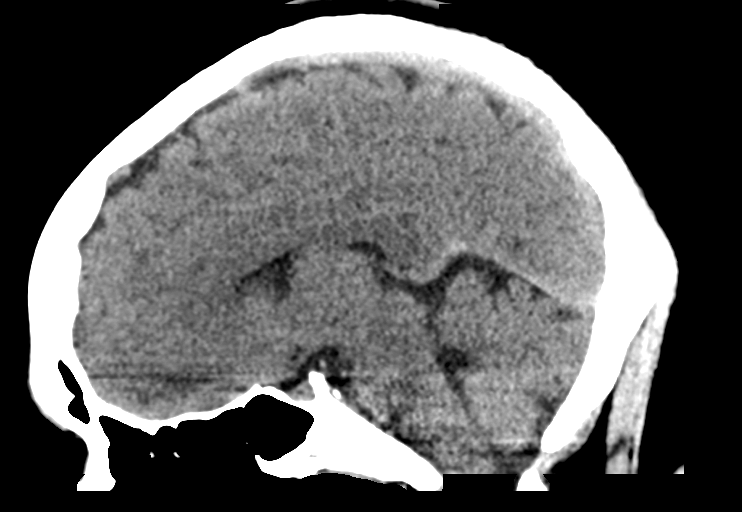
[im 41/62  brain]
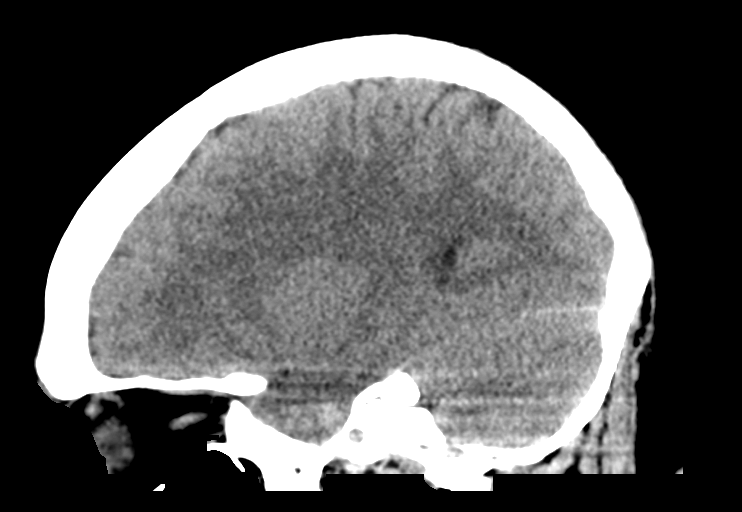

[15 of 46 positions shown; findings below may reference images not displayed]

FINDINGS: Brain: The ventricles are normal in size and configuration. No
extra-axial fluid collections are identified. The gray-white
differentiation is maintained. No CT findings for acute hemispheric
infarction or intracranial hemorrhage. No mass lesions. The
brainstem and cerebellum are normal.

Vascular: No hyperdense vessels or obvious aneurysm.

Skull: No acute skull fracture.  No bone lesion.

Sinuses/Orbits: The paranasal sinuses and mastoid air cells are
clear. The globes are intact.

Other: No scalp lesions, laceration or hematoma.
IMPRESSION: Normal head CT.
